# Patient Record
Sex: Male | Born: 1995 | Race: Black or African American | Hispanic: No | Marital: Single | Smoking: Never smoker
Health system: Southern US, Community
[De-identification: ages and names within clinical notes are randomized; demographics above are authoritative.]

## PROBLEM LIST (undated history)

## (undated) DIAGNOSIS — F419 Anxiety disorder, unspecified: Secondary | ICD-10-CM

## (undated) DIAGNOSIS — F32A Depression, unspecified: Secondary | ICD-10-CM

## (undated) DIAGNOSIS — F329 Major depressive disorder, single episode, unspecified: Secondary | ICD-10-CM

---
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
## 2014-03-09 ENCOUNTER — Emergency Department (HOSPITAL_COMMUNITY)
Admission: EM | Admit: 2014-03-09 | Discharge: 2014-03-09 | Disposition: A | Discharge status: Home / Self Care | Payer: 59 | Attending: Emergency Medicine | Admitting: Emergency Medicine

## 2014-03-09 ENCOUNTER — Emergency Department (HOSPITAL_COMMUNITY): Payer: 59

## 2014-03-09 ENCOUNTER — Encounter (HOSPITAL_COMMUNITY): Payer: Self-pay | Admitting: Emergency Medicine

## 2014-03-09 DIAGNOSIS — S60412A Abrasion of right middle finger, initial encounter: Secondary | ICD-10-CM | POA: Insufficient documentation

## 2014-03-09 DIAGNOSIS — Y280XXA Contact with sharp glass, undetermined intent, initial encounter: Secondary | ICD-10-CM | POA: Diagnosis not present

## 2014-03-09 DIAGNOSIS — Y9289 Other specified places as the place of occurrence of the external cause: Secondary | ICD-10-CM | POA: Insufficient documentation

## 2014-03-09 DIAGNOSIS — W228XXA Striking against or struck by other objects, initial encounter: Secondary | ICD-10-CM | POA: Diagnosis not present

## 2014-03-09 DIAGNOSIS — L03011 Cellulitis of right finger: Secondary | ICD-10-CM | POA: Insufficient documentation

## 2014-03-09 DIAGNOSIS — S6991XA Unspecified injury of right wrist, hand and finger(s), initial encounter: Secondary | ICD-10-CM | POA: Diagnosis present

## 2014-03-09 DIAGNOSIS — Y9389 Activity, other specified: Secondary | ICD-10-CM | POA: Insufficient documentation

## 2014-03-09 MED ORDER — IBUPROFEN 200 MG PO TABS
400.0000 mg | ORAL_TABLET | Freq: Once | ORAL | Status: AC
  Administered 2014-03-09: 400 mg via ORAL
  Filled 2014-03-09: qty 2

## 2014-03-09 MED ORDER — NAPROXEN 500 MG PO TABS: 500.0000 mg | ORAL_TABLET | Freq: Two times a day (BID) | ORAL | Status: DC

## 2014-03-09 MED ORDER — OXYCODONE-ACETAMINOPHEN 5-325 MG PO TABS
1.0000 | ORAL_TABLET | Freq: Once | ORAL | Status: AC
  Administered 2014-03-09: 1 via ORAL
  Filled 2014-03-09: qty 1

## 2014-03-09 MED ORDER — CEPHALEXIN 500 MG PO CAPS: 500.0000 mg | ORAL_CAPSULE | Freq: Four times a day (QID) | ORAL | Status: DC
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
## 2014-03-09 NOTE — Discharge Instructions (Signed)
Please follow up in 2 days to have a wound recheck. Take your antibiotics as prescribed.    Cellulitis Cellulitis is an infection of the skin and the tissue beneath it. The infected area is usually red and tender. Cellulitis occurs most often in the arms and lower legs.  CAUSES  Cellulitis is caused by bacteria that enter the skin through cracks or cuts in the skin. The most common types of bacteria that cause cellulitis are staphylococci and streptococci. SIGNS AND SYMPTOMS   Redness and warmth.  Swelling.  Tenderness or pain.  Fever. DIAGNOSIS  Your health care provider can usually determine what is wrong based on a physical exam. Blood tests may also be done. TREATMENT  Treatment usually involves taking an antibiotic medicine. HOME CARE INSTRUCTIONS   Take your antibiotic medicine as directed by your health care provider. Finish the antibiotic even if you start to feel better.  Keep the infected arm or leg elevated to reduce swelling.  Apply a warm cloth to the affected area up to 4 times per day to relieve pain.  Take medicines only as directed by your health care provider.  Keep all follow-up visits as directed by your health care provider. SEEK MEDICAL CARE IF:   You notice red streaks coming from the infected area.  Your red area gets larger or turns dark in color.  Your bone or joint underneath the infected area becomes painful after the skin has healed.  Your infection returns in the same area or another area.  You notice a swollen bump in the infected area.  You develop new symptoms.  You have a fever. SEEK IMMEDIATE MEDICAL CARE IF:   You feel very sleepy.  You develop vomiting or diarrhea.  You have a general ill feeling (malaise) with muscle aches and pains. MAKE SURE YOU:   Understand these instructions.  Will watch your condition.  Will get help right away if you are not doing well or get worse. Document Released: 02/16/2005 Document  Revised: 09/23/2013 Document Reviewed: 07/25/2011 [facility] Patient Information 2015 [place], [place]. This information is not intended to replace advice given to you by your health care provider. Make sure you discuss any questions you have with your health care provider.
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
## 2014-03-12 ENCOUNTER — Encounter (HOSPITAL_COMMUNITY): Payer: Self-pay | Admitting: Emergency Medicine

## 2014-03-12 ENCOUNTER — Emergency Department (HOSPITAL_COMMUNITY)
Admission: EM | Admit: 2014-03-12 | Discharge: 2014-03-12 | Disposition: A | Discharge status: Home / Self Care | Payer: 59 | Attending: Emergency Medicine | Admitting: Emergency Medicine

## 2014-03-12 DIAGNOSIS — Z4801 Encounter for change or removal of surgical wound dressing: Secondary | ICD-10-CM | POA: Insufficient documentation

## 2014-03-12 DIAGNOSIS — Z791 Long term (current) use of non-steroidal anti-inflammatories (NSAID): Secondary | ICD-10-CM | POA: Diagnosis not present

## 2014-03-12 DIAGNOSIS — Z5189 Encounter for other specified aftercare: Secondary | ICD-10-CM

## 2014-03-12 DIAGNOSIS — L03113 Cellulitis of right upper limb: Secondary | ICD-10-CM

## 2014-03-12 DIAGNOSIS — M791 Myalgia: Secondary | ICD-10-CM | POA: Diagnosis not present

## 2014-03-12 DIAGNOSIS — Z792 Long term (current) use of antibiotics: Secondary | ICD-10-CM | POA: Diagnosis not present

## 2014-03-12 NOTE — Discharge Instructions (Signed)
Call for a follow up appointment with a Family or Primary Care Provider.  Call a hand specialist for further evaluation of your hand pain injury. Ice and elevate your hand above your heart to reduce swelling. Return if Symptoms worsen.   Take medication as prescribed.

## 2014-03-12 NOTE — ED Provider Notes (Signed)
Medical screening examination/treatment/procedure(s) were performed by non-physician practitioner and as supervising physician I was immediately available for consultation/collaboration.   EKG Interpretation None        Lyanne CoKevin M Darienne Belleau, MD 03/12/14 1219

## 2014-03-12 NOTE — ED Notes (Signed)
Pt was seen here on 10/18 for wound to right hand and treated for cellulitis. Reports taking antibiotics as prescribed. Mild redness and swelling noted but reports improvement since 10/18, denies any fever. No acute distress noted.
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
## 2014-03-13 NOTE — ED Provider Notes (Signed)
Medical screening examination/treatment/procedure(s) were performed by non-physician practitioner and as supervising physician I was immediately available for consultation/collaboration.   EKG Interpretation None       Jeremyah Jelley, MD 03/13/14 0835 

## 2016-02-22 ENCOUNTER — Emergency Department (HOSPITAL_COMMUNITY): Payer: No Typology Code available for payment source

## 2016-02-22 ENCOUNTER — Emergency Department (HOSPITAL_COMMUNITY)
Admission: EM | Admit: 2016-02-22 | Discharge: 2016-02-22 | Disposition: A | Discharge status: Home / Self Care | Payer: No Typology Code available for payment source | Attending: Emergency Medicine | Admitting: Emergency Medicine

## 2016-02-22 ENCOUNTER — Encounter (HOSPITAL_COMMUNITY): Payer: Self-pay | Admitting: Nurse Practitioner

## 2016-02-22 DIAGNOSIS — Y939 Activity, unspecified: Secondary | ICD-10-CM | POA: Diagnosis not present

## 2016-02-22 DIAGNOSIS — Y999 Unspecified external cause status: Secondary | ICD-10-CM | POA: Insufficient documentation

## 2016-02-22 DIAGNOSIS — Y92410 Unspecified street and highway as the place of occurrence of the external cause: Secondary | ICD-10-CM | POA: Insufficient documentation

## 2016-02-22 DIAGNOSIS — S79911A Unspecified injury of right hip, initial encounter: Secondary | ICD-10-CM | POA: Diagnosis present

## 2016-02-22 DIAGNOSIS — S7001XA Contusion of right hip, initial encounter: Secondary | ICD-10-CM | POA: Diagnosis not present

## 2016-02-22 MED ORDER — METHOCARBAMOL 500 MG PO TABS
1000.0000 mg | ORAL_TABLET | Freq: Four times a day (QID) | ORAL | 0 refills | Status: DC
Start: 2016-02-22 — End: 2016-07-13
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
## 2016-02-22 NOTE — ED Notes (Signed)
Declined W/C at D/C and was escorted to lobby by RN. 
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
## 2016-07-10 ENCOUNTER — Inpatient Hospital Stay (HOSPITAL_COMMUNITY)
Admission: AD | Admit: 2016-07-10 | Discharge: 2016-07-13 | DRG: 885 | Disposition: A | Discharge status: Home / Self Care | Payer: 59 | Attending: Psychiatry | Admitting: Psychiatry

## 2016-07-10 ENCOUNTER — Encounter (HOSPITAL_COMMUNITY): Payer: Self-pay

## 2016-07-10 DIAGNOSIS — F419 Anxiety disorder, unspecified: Secondary | ICD-10-CM | POA: Diagnosis present

## 2016-07-10 DIAGNOSIS — F1220 Cannabis dependence, uncomplicated: Secondary | ICD-10-CM | POA: Diagnosis present

## 2016-07-10 DIAGNOSIS — F411 Generalized anxiety disorder: Secondary | ICD-10-CM | POA: Diagnosis present

## 2016-07-10 DIAGNOSIS — F322 Major depressive disorder, single episode, severe without psychotic features: Secondary | ICD-10-CM | POA: Diagnosis present

## 2016-07-10 DIAGNOSIS — Z79899 Other long term (current) drug therapy: Secondary | ICD-10-CM | POA: Diagnosis not present

## 2016-07-10 DIAGNOSIS — R45851 Suicidal ideations: Secondary | ICD-10-CM | POA: Diagnosis present

## 2016-07-10 DIAGNOSIS — G4700 Insomnia, unspecified: Secondary | ICD-10-CM | POA: Diagnosis present

## 2016-07-10 DIAGNOSIS — Z811 Family history of alcohol abuse and dependence: Secondary | ICD-10-CM | POA: Diagnosis not present

## 2016-07-10 HISTORY — DX: Anxiety disorder, unspecified: F41.9

## 2016-07-10 HISTORY — DX: Depression, unspecified: F32.A

## 2016-07-10 HISTORY — DX: Major depressive disorder, single episode, unspecified: F32.9

## 2016-07-10 MED ORDER — TRAZODONE HCL 50 MG PO TABS
50.0000 mg | ORAL_TABLET | Freq: Every evening | ORAL | Status: DC | PRN
  Filled 2016-07-10 (×5): qty 1

## 2016-07-10 MED ORDER — ALUM & MAG HYDROXIDE-SIMETH 200-200-20 MG/5ML PO SUSP
30.0000 mL | ORAL | Status: DC | PRN
Start: 2016-07-10 — End: 2016-07-13

## 2016-07-10 MED ORDER — MAGNESIUM HYDROXIDE 400 MG/5ML PO SUSP: 30.0000 mL | Freq: Every day | ORAL | Status: DC | PRN

## 2016-07-10 MED ORDER — ACETAMINOPHEN 325 MG PO TABS
650.0000 mg | ORAL_TABLET | Freq: Four times a day (QID) | ORAL | Status: DC | PRN
  Administered 2016-07-12: 650 mg via ORAL
  Filled 2016-07-10: qty 2

## 2016-07-10 MED ORDER — HYDROXYZINE HCL 25 MG PO TABS
25.0000 mg | ORAL_TABLET | Freq: Three times a day (TID) | ORAL | Status: DC | PRN
  Filled 2016-07-10: qty 10
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
## 2016-07-11 ENCOUNTER — Encounter (HOSPITAL_COMMUNITY): Payer: Self-pay

## 2016-07-11 DIAGNOSIS — Z79899 Other long term (current) drug therapy: Secondary | ICD-10-CM

## 2016-07-11 DIAGNOSIS — F322 Major depressive disorder, single episode, severe without psychotic features: Principal | ICD-10-CM

## 2016-07-11 LAB — CBC
HEMATOCRIT: 42.2 % (ref 39.0–52.0)
HEMOGLOBIN: 14.2 g/dL (ref 13.0–17.0)
MCH: 28.3 pg (ref 26.0–34.0)
MCHC: 33.6 g/dL (ref 30.0–36.0)
MCV: 84.1 fL (ref 78.0–100.0)
Platelets: 244 10*3/uL (ref 150–400)
RBC: 5.02 MIL/uL (ref 4.22–5.81)
RDW: 13.9 % (ref 11.5–15.5)
WBC: 7.5 10*3/uL (ref 4.0–10.5)

## 2016-07-11 LAB — COMPREHENSIVE METABOLIC PANEL
ALK PHOS: 63 U/L (ref 38–126)
ALT: 12 U/L — ABNORMAL LOW (ref 17–63)
ANION GAP: 7 (ref 5–15)
AST: 15 U/L (ref 15–41)
Albumin: 4.9 g/dL (ref 3.5–5.0)
BILIRUBIN TOTAL: 0.7 mg/dL (ref 0.3–1.2)
BUN: 12 mg/dL (ref 6–20)
CO2: 30 mmol/L (ref 22–32)
Calcium: 10.1 mg/dL (ref 8.9–10.3)
Chloride: 106 mmol/L (ref 101–111)
Creatinine, Ser: 1.11 mg/dL (ref 0.61–1.24)
GFR calc Af Amer: >60 mL/min (ref >60)
GFR calc non Af Amer: >60 mL/min (ref >60)
GLUCOSE: 101 mg/dL — AB (ref 65–99)
POTASSIUM: 4.1 mmol/L (ref 3.5–5.1)
SODIUM: 143 mmol/L (ref 135–145)
Total Protein: 7.6 g/dL (ref 6.5–8.1)

## 2016-07-11 LAB — RAPID URINE DRUG SCREEN, HOSP PERFORMED
Amphetamines: NOT DETECTED
Barbiturates: NOT DETECTED
Benzodiazepines: POSITIVE — AB
Cocaine: NOT DETECTED
OPIATES: NOT DETECTED
Tetrahydrocannabinol: POSITIVE — AB

## 2016-07-11 LAB — LIPID PANEL
CHOL/HDL RATIO: 2.3 ratio
Cholesterol: 117 mg/dL (ref 0–200)
HDL: 52 mg/dL (ref >40)
LDL CALC: 47 mg/dL (ref 0–99)
Triglycerides: 88 mg/dL (ref <150)
VLDL: 18 mg/dL (ref 0–40)

## 2016-07-11 LAB — TSH: TSH: 0.911 u[IU]/mL (ref 0.350–4.500)

## 2016-07-11 MED ORDER — MIRTAZAPINE 7.5 MG PO TABS
7.5000 mg | ORAL_TABLET | Freq: Every day | ORAL | Status: DC
  Administered 2016-07-11 – 2016-07-12 (×2): 7.5 mg via ORAL
  Filled 2016-07-11: qty 7
  Filled 2016-07-11 (×2): qty 1
  Filled 2016-07-11: qty 7
  Filled 2016-07-11 (×2): qty 1
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
## 2016-07-11 NOTE — Tx Team (Signed)
Initial Treatment Plan 07/11/2016 12:17 AM Nathan Warren WUJ:[id number]    PATIENT STRESSORS: Educational concerns Financial difficulties Loss of personal relationship Marital or family conflict   PATIENT STRENGTHS: Capable of independent living Communication skills Physical Health Supportive family/friends   PATIENT IDENTIFIED PROBLEMS: "Need help with my depression"  "Need help with feeling suicidal"  Depression  Anxiety  Risk for suicide             DISCHARGE CRITERIA:  Ability to meet basic life and health needs Safe-care adequate arrangements made Verbal commitment to aftercare and medication compliance  PRELIMINARY DISCHARGE PLAN: Return to previous living arrangement Return to previous work or school arrangements  PATIENT/FAMILY INVOLVEMENT: This treatment plan has been presented to and reviewed with the patient, Nathan Warren, and/or family member.  The patient and family have been given the opportunity to ask questions and make suggestions.  Eveline KetoAdediran T [name], RN 07/11/2016, 12:17 AM

## 2016-07-11 NOTE — Progress Notes (Signed)
Recreation Therapy Notes  Date: 07/11/16 Time: 0930 Location: 300 Hall Dayroom  Group Topic: Stress Management  Goal Area(s) Addresses:  Patient will verbalize importance of using healthy stress management.  Patient will identify positive emotions associated with healthy stress management.   Intervention: Stress Management  Activity :  Guided Imagery.  LRT introduced to thebstress management technique guided imagery.  LRT read a script that allowed patients to take a "mental vacation" from their surroundings.  Patients were to follow along as LRT read script to engage in the technique.   Education:  Stress Management, Discharge Planning.   Education Outcome: Acknowledges edcuation/In group clarification offered/Needs additional education  Clinical Observations/Feedback: Pt did not attend group.    Karstyn Birkey, LRT/CTRS         Nathan Warren A 07/11/2016 12:03 PM 
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
## 2016-07-11 NOTE — [facility] Suicide Risk Assessment (Signed)
[facility] INPATIENT:  Family/Significant Other Suicide Prevention Education  Suicide Prevention Education:  Education Completed; Conrad Burlingtonisha Steffler-Miller, Pt's mother [phone], has been identified by the patient as the family member/significant other with whom the patient will be residing, and identified as the person(s) who will aid the patient in the event of a mental health crisis (suicidal ideations/suicide attempt).  With written consent from the patient, the family member/significant other has been provided the following suicide prevention education, prior to the and/or following the discharge of the patient.  The suicide prevention education provided includes the following:  Suicide risk factors  Suicide prevention and interventions  National Suicide Hotline telephone number  [facility] assessment telephone number  [facility] Emergency Assistance 911  [facility] and/or Residential Mobile Crisis Unit telephone number  Request made of family/significant other to:  Remove weapons (e.g., guns, rifles, knives), all items previously/currently identified as safety concern.    Remove drugs/medications (over-the-counter, prescriptions, illicit drugs), all items previously/currently identified as a safety concern.  The family member/significant other verbalizes understanding of the suicide prevention education information provided.  The family member/significant other agrees to remove the items of safety concern listed above.  Verdene LennertLauren C Nawal Burling 07/11/2016, 4:17 PM
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
## 2016-07-11 NOTE — [facility] Suicide Risk Assessment (Signed)
[facility]   Nursing information obtained from:  Patient Demographic factors:  Male, Adolescent or young adult, Unemployed Current Mental Status:  Self-harm thoughts Loss Factors:  Loss of significant relationship, Financial problems / change in socioeconomic status Historical Factors:  NA Risk Reduction Factors:  Living with another person, especially a relative  Total Time spent with patient: 45 minutes Principal Problem:  MDD  Diagnosis:   Patient Active Problem List   Diagnosis Date Noted  . Severe major depression ([facility]) [F32.2] 07/10/2016     Continued Clinical Symptoms:  Alcohol Use Disorder Identification Test Final Score (AUDIT): 11 The "Alcohol Use Disorders Identification Test", Guidelines for Use in Primary Care, Second Edition.  World Science writerHealth Organization [facility](WHO). Score between 0-7:  no or low risk or alcohol related problems. Score between 8-15:  moderate risk of alcohol related problems. Score between 16-19:  high risk of alcohol related problems. Score 20 or above:  warrants further diagnostic evaluation for alcohol dependence and treatment.   CLINICAL FACTORS:  [age] year old single male, reports recent depression, following break up with GF two weeks ago. Reports impulsive suicidal attempt by choking self two weeks ago, and some intermittent passive SI since then. Brought to hospital by family members after he stopped answering his phone for a few hours, but states he has actually been feeling a little better and no longer suicidal . Does endorse neuro -vegetative symptoms.   Psychiatric Specialty Exam: Physical Exam  ROS  Blood pressure 120/78, pulse 91, temperature 98.5 F (36.9 C), temperature source Oral, resp. rate 16, height 5' 8.9" (1.75 m), weight 60.3 kg (133 lb), SpO2 100 %.Body mass index is 19.7 kg/m.  See admit note MSE                                                         COGNITIVE FEATURES THAT  CONTRIBUTE TO RISK:  Closed-mindedness and Loss of executive function    SUICIDE RISK:   Moderate:  Frequent suicidal ideation with limited intensity, and duration, some specificity in terms of plans, no associated intent, good self-control, limited dysphoria/symptomatology, some risk factors present, and identifiable protective factors, including available and accessible social support.  PLAN OF CARE: Patient will be admitted to inpatient psychiatric unit for stabilization and safety. Will provide and encourage milieu participation. Provide medication management and maked adjustments as needed.  Will follow daily.    I certify that inpatient services furnished can reasonably be expected to improve the patient's condition.   Nehemiah MassedOBOS, FERNANDO, MD 07/11/2016, 6:10 PM
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
## 2016-07-11 NOTE — Progress Notes (Signed)
Patient ID: Nathan PikeJamir Warren, male   DOB: 1995-10-04, 21 y.o.   MRN: [id number]  DAR: Pt. Denies SI/HI and A/V Hallucinations. He refused to fill out his self inventory sheet. Patient does not report any pain or discomfort at this time. Support and encouragement provided to the patient however patient reports he just wants to go home. He is isolated to his room and is not interacting with his peers. Patient does not appear vested in treatment at this time. Q15 minute checks are maintained for safety.

## 2016-07-12 NOTE — Progress Notes (Signed)
Pt attend group. His day was 7. Pt said his goal was to relax. This is pt first time attend wrap up group.

## 2016-07-12 NOTE — Progress Notes (Signed)
[facility] MD Progress Note  07/12/2016 5:05 PM Nathan Warren  MRN:  [id number] Subjective:  Patient states he is feeling better. At this time denies any suicidal or self injurious ideations . Focused on going home soon and returning to college. Regarding recent break up, states " it still bothers me but not that much anymore" and states he realizes he needs to focus on himself and his career. Denies medication side effects thus far . Objective : I have discussed case with treatment team and have met with patient. Patient reports he is feeling better and at this time his focus is to discharge soon. Focused on returning to college , and as above, less focused on recent break up. Presents vaguely irritable, but affect readily improves during session, and starts smiling often, appropriately with support, empathy.  Denies any suicidal or self injurious ideations. Behavior on unit calm, in good control, but tends to stay to self and limited group participation. Patient states he tends to feel uncomfortable in group setting, does better on " one to one" basis, but of note, denies any social anxiety symptoms. Denies medication side effects Principal Problem:  Depression Diagnosis:   Patient Active Problem List   Diagnosis Date Noted  . Severe major depression ([place]) [F32.2] 07/10/2016   Total Time spent with patient: 20 minutes  Past Medical History:  Past Medical History:  Diagnosis Date  . Anxiety   . Depression    History reviewed. No pertinent surgical history. Family History: History reviewed. No pertinent family history.  Social History:  History  Alcohol Use  . 3.6 oz/week  . 6 Cans of beer per week     History  Drug Use  . Frequency: 1.0 time per week  . Types: Marijuana    Social History   Social History  . Marital status: Single    Spouse name: N/A  . Number of children: N/A  . Years of education: N/A   Social History Main Topics  . Smoking status: Never Smoker  . Smokeless  tobacco: Never Used  . Alcohol use 3.6 oz/week    6 Cans of beer per week  . Drug use: Yes    Frequency: 1.0 time per week    Types: Marijuana  . Sexual activity: Yes   Other Topics Concern  . None   Social History Narrative  . None   Additional Social History:    Pain Medications: See MAR Prescriptions: See MAR Over the Counter: See MAR History of alcohol / drug use?: Yes Negative Consequences of Use: Personal relationships, Work / School Name of Substance 1: Marijuana 1 - Age of First Use: UTA 1 - Amount (size/oz): Pt reported, smoking marijuana two days ago.  1 - Frequency: UTA 1 - Duration: UTA 1 - Last Use / Amount: Pt reported, smoking two days ago.  Name of Substance 2: Alcohol 2 - Age of First Use: UTA 2 - Amount (size/oz): Pt reported, "about 6 cans of beer a week" 2 - Frequency: UTA 2 - Duration: UTA 2 - Last Use / Amount: Pt reported, today.   Sleep: Good  Appetite:  Good  Current Medications: Current Facility-Administered Medications  Medication Dose Route Frequency Provider Last Rate Last Dose  . acetaminophen (TYLENOL) tablet 650 mg  650 mg Oral Q6H PRN Rozetta Nunnery, NP   650 mg at 07/12/16 1215  . alum & mag hydroxide-simeth (MAALOX/MYLANTA) 200-200-20 MG/5ML suspension 30 mL  30 mL Oral Q4H PRN Rozetta Nunnery, NP      .  hydrOXYzine (ATARAX/VISTARIL) tablet 25 mg  25 mg Oral TID PRN Rozetta Nunnery, NP      . magnesium hydroxide (MILK OF MAGNESIA) suspension 30 mL  30 mL Oral Daily PRN Rozetta Nunnery, NP      . mirtazapine (REMERON) tablet 7.5 mg  7.5 mg Oral QHS Jenne Campus, MD   7.5 mg at 07/11/16 2103    Lab Results:  Results for orders placed or performed during the hospital encounter of 07/10/16 (from the past 48 hour(s))  Urine rapid drug screen (hosp performed)not at [facility]     Status: Abnormal   Collection Time: 07/11/16  5:00 AM  Result Value Ref Range   Opiates NONE DETECTED NONE DETECTED   Cocaine NONE DETECTED NONE DETECTED    Benzodiazepines POSITIVE (A) NONE DETECTED   Amphetamines NONE DETECTED NONE DETECTED   Tetrahydrocannabinol POSITIVE (A) NONE DETECTED   Barbiturates NONE DETECTED NONE DETECTED    Comment:        DRUG SCREEN FOR MEDICAL PURPOSES ONLY.  IF CONFIRMATION IS NEEDED FOR ANY PURPOSE, NOTIFY LAB WITHIN 5 DAYS.        LOWEST DETECTABLE LIMITS FOR URINE DRUG SCREEN Drug Class       Cutoff (ng/mL) Amphetamine      1000 Barbiturate      200 Benzodiazepine   945 Tricyclics       859 Opiates          300 Cocaine          300 THC              50 Performed at [facility], [place] [address] [place], [place] [address]   CBC     Status: None   Collection Time: 07/11/16  6:01 AM  Result Value Ref Range   WBC 7.5 4.0 - 10.5 K/uL   RBC 5.02 4.22 - 5.81 MIL/uL   Hemoglobin 14.2 13.0 - 17.0 g/dL   HCT 42.2 39.0 - 52.0 %   MCV 84.1 78.0 - 100.0 fL   MCH 28.3 26.0 - 34.0 pg   MCHC 33.6 30.0 - 36.0 g/dL   RDW 13.9 11.5 - 15.5 %   Platelets 244 150 - 400 K/uL    Comment: Performed at [facility], [place] [address] [place], [place] [address]  Comprehensive metabolic panel     Status: Abnormal   Collection Time: 07/11/16  6:01 AM  Result Value Ref Range   Sodium 143 135 - 145 mmol/L   Potassium 4.1 3.5 - 5.1 mmol/L   Chloride 106 101 - 111 mmol/L   CO2 30 22 - 32 mmol/L   Glucose, Bld 101 (H) 65 - 99 mg/dL   BUN 12 6 - 20 mg/dL   Creatinine, Ser 1.11 0.61 - 1.24 mg/dL   Calcium 10.1 8.9 - 10.3 mg/dL   Total Protein 7.6 6.5 - 8.1 g/dL   Albumin 4.9 3.5 - 5.0 g/dL   AST 15 15 - 41 U/L   ALT 12 (L) 17 - 63 U/L   Alkaline Phosphatase 63 38 - 126 U/L   Total Bilirubin 0.7 0.3 - 1.2 mg/dL   GFR calc non Af Amer >60 >60 mL/min   GFR calc Af Amer >60 >60 mL/min    Comment: (NOTE) The eGFR has been calculated using the CKD EPI equation. This calculation has not been validated in all clinical situations. eGFR's persistently <60 mL/min signify possible Chronic  Kidney Disease.  Anion gap 7 5 - 15    Comment: Performed at [facility], [place] [address], [place], [place] [address]  Lipid panel     Status: None   Collection Time: 07/11/16  6:01 AM  Result Value Ref Range   Cholesterol 117 0 - 200 mg/dL   Triglycerides 88 <150 mg/dL   HDL 52 >40 mg/dL   Total CHOL/HDL Ratio 2.3 RATIO   VLDL 18 0 - 40 mg/dL   LDL Cholesterol 47 0 - 99 mg/dL    Comment:        Total Cholesterol/HDL:CHD Risk Coronary Heart Disease Risk Table                     Men   Women  1/2 Average Risk   3.4   3.3  Average Risk       5.0   4.4  2 X Average Risk   9.6   7.1  3 X Average Risk  23.4   11.0        Use the calculated Patient Ratio above and the CHD Risk Table to determine the patient's CHD Risk.        ATP III CLASSIFICATION (LDL):  <100     mg/dL   Optimal  100-129  mg/dL   Near or Above                    Optimal  130-159  mg/dL   Borderline  160-189  mg/dL   High  >190     mg/dL   Very High Performed at [place] [address] [place], [place] [address]   TSH     Status: None   Collection Time: 07/11/16  6:01 AM  Result Value Ref Range   TSH 0.911 0.350 - 4.500 uIU/mL    Comment: Performed by a 3rd Generation assay with a functional sensitivity of <=0.01 uIU/mL. Performed at [facility], [place] [address], [place], [place] [address]     Blood Alcohol level:  No results found for: [facility]  Metabolic Disorder Labs: No results found for: HGBA1C, MPG No results found for: PROLACTIN Lab Results  Component Value Date   CHOL 117 07/11/2016   TRIG 88 07/11/2016   HDL 52 07/11/2016   CHOLHDL 2.3 07/11/2016   VLDL 18 07/11/2016   LDLCALC 47 07/11/2016    Physical Findings: AIMS: Facial and Oral Movements Muscles of Facial Expression: None, normal Lips and Perioral Area: None, normal Jaw: None, normal Tongue: None, normal,Extremity Movements Upper (arms, wrists, hands, fingers): None,  normal Lower (legs, knees, ankles, toes): None, normal, Trunk Movements Neck, shoulders, hips: None, normal, Overall Severity Severity of abnormal movements (highest score from questions above): None, normal Incapacitation due to abnormal movements: None, normal Patient's awareness of abnormal movements (rate only patient's report): No Awareness, Dental Status Current problems with teeth and/or dentures?: No Does patient usually wear dentures?: No  CIWA:    COWS:     Musculoskeletal: Strength & Muscle Tone: within normal limits Gait & Station: normal Patient leans: N/A  Psychiatric Specialty Exam: Physical Exam  ROS denies headache, denies chest pain, denies shortness of breath, no rash   Blood pressure 124/77, pulse 74, temperature 98 F (36.7 C), temperature source Oral, resp. rate 16, height 5' 8.9" (1.75 m), weight 60.3 kg (133 lb), SpO2 100 %.Body mass index is 19.7 kg/m.  General Appearance: improved grooming   Eye Contact:  Good  Speech:  Normal Rate  Volume:  Normal  Mood:  states his mood is better, and currently minimizes depression  Affect:  mildly irritable at first, but this improved significantly during session, presenting with a fuller range of affect as session progressed   Thought Process:  Linear and Descriptions of Associations: Intact  Orientation:  Full (Time, Place, and Person)  Thought Content:  denies hallucinations, no delusions, not internally preoccupied   Suicidal Thoughts:  No denies any suicidal or self injurious ideations, contracts for safety on unit, denies any homicidal or violent ideations  Homicidal Thoughts:  No  Memory:  recent and remote grossly intact   Judgement:  Fair- improving   Insight:  Fair  Psychomotor Activity:  Normal  Concentration:  Concentration: Good and Attention Span: Good  Recall:  Good  Fund of Knowledge:  Good  Language:  Good  Akathisia:  Negative  Handed:  Right  AIMS (if indicated):     Assets:  Desire for  Improvement Resilience  ADL's:  Intact  Cognition:  WNL  Sleep:  Number of Hours: 6.75   Assessment - patient reports feeling better, minimizes depression at this time, and denies any suicidal or self injurious ideations. Presents future oriented, and is focused on discharging soon so he can return to college , minimize interruption to his academic requirements . Less focused and less ruminative about recent break up. Of note, also denies any violent or homicidal ideation towards ex GF.  Limited milieu participation . Thus far tolerating Remeron well, denies side effects.  Treatment Plan Summary: Daily contact with patient to assess and evaluate symptoms and progress in treatment, Medication management, Plan inpatient treatment and medications as below Encourage milieu and group participation to work on coping skills and symptom reduction  Continue Remeron 7.5 mgrs QHS for depression, insomnia Continue Vistaril 25 mgrs TID PRN for anxiety Treatment team working on disposition planning options  Neita Garnet, MD 07/12/2016, 5:05 PM

## 2016-07-12 NOTE — Progress Notes (Signed)
D: Pt was in the hallway upon initial approach.  Pt presents with appropriate affect and mood.  He has been more visible in milieu tonight.  He smiles at times while interacting with peers.  Pt reports he "shot basketball" today and he had a good visit with his mother and uncle.  Pt denies SI/HI, denies hallucinations, denies pain.  Pt attended evening group.    A: Actively listened to pt and offered support and encouragement. Medication administered per order.  Q15 minute safety checks maintained.  R: Pt is safe on the unit.  Pt is compliant with medications.  Pt verbally contracts for safety.

## 2016-07-12 NOTE — Plan of Care (Signed)
Problem: Medication: Goal: Compliance with prescribed medication regimen will improve Outcome: Progressing Pt has been compliant with medication regimen tonight.   

## 2016-07-12 NOTE — Tx Team (Signed)
Interdisciplinary Treatment and Diagnostic Plan Update  07/12/2016 Time of Session: 9:30am Nathan Warren MRN: [id number]  Principal Diagnosis: MDD, no psychotic features   Secondary Diagnoses: Active Problems:   Severe major depression (HCC)   Current Medications:  Current Facility-Administered Medications  Medication Dose Route Frequency Provider Last Rate Last Dose  . acetaminophen (TYLENOL) tablet 650 mg  650 mg Oral Q6H PRN Jackelyn Poling, NP      . alum & mag hydroxide-simeth (MAALOX/MYLANTA) 200-200-20 MG/5ML suspension 30 mL  30 mL Oral Q4H PRN Jackelyn Poling, NP      . hydrOXYzine (ATARAX/VISTARIL) tablet 25 mg  25 mg Oral TID PRN Jackelyn Poling, NP      . magnesium hydroxide (MILK OF MAGNESIA) suspension 30 mL  30 mL Oral Daily PRN Jackelyn Poling, NP      . mirtazapine (REMERON) tablet 7.5 mg  7.5 mg Oral QHS Craige Cotta, MD   7.5 mg at 07/11/16 2103    PTA Medications: Prescriptions Prior to Admission  Medication Sig Dispense Refill Last Dose  . methocarbamol (ROBAXIN) 500 MG tablet Take 2 tablets (1,000 mg total) by mouth 4 (four) times daily. (Patient not taking: Reported on 07/11/2016) 20 tablet 0 Unknown at Unknown time    Treatment Modalities: Medication Management, Group therapy, Case management,  1 to 1 session with clinician, Psychoeducation, Recreational therapy.  Patient Stressors: [employer] fast streamer difficulties Loss of personal relationship Marital or family conflict  Patient Strengths: Capable of independent living Manufacturing systems engineer Physical Health Supportive family/friends  Physician Treatment Plan for Primary Diagnosis: MDD, no psychotic features  Long Term Goal(s): Improvement in symptoms so as ready for discharge  Short Term Goals: Ability to verbalize feelings will improve Ability to disclose and discuss suicidal ideas Ability to demonstrate self-control will improve Ability to identify and develop effective coping behaviors will  improve Ability to maintain clinical measurements within normal limits will improve Ability to verbalize feelings will improve Ability to disclose and discuss suicidal ideas Ability to demonstrate self-control will improve Ability to identify and develop effective coping behaviors will improve Ability to maintain clinical measurements within normal limits will improve  Medication Management: Evaluate patient's response, side effects, and tolerance of medication regimen.  Therapeutic Interventions: 1 to 1 sessions, Unit Group sessions and Medication administration.  Evaluation of Outcomes: Adequate for Discharge  Physician Treatment Plan for Secondary Diagnosis: Active Problems:   Severe major depression (HCC)   Long Term Goal(s): Improvement in symptoms so as ready for discharge  Short Term Goals: Ability to verbalize feelings will improve Ability to disclose and discuss suicidal ideas Ability to demonstrate self-control will improve Ability to identify and develop effective coping behaviors will improve Ability to maintain clinical measurements within normal limits will improve Ability to verbalize feelings will improve Ability to disclose and discuss suicidal ideas Ability to demonstrate self-control will improve Ability to identify and develop effective coping behaviors will improve Ability to maintain clinical measurements within normal limits will improve  Medication Management: Evaluate patient's response, side effects, and tolerance of medication regimen.  Therapeutic Interventions: 1 to 1 sessions, Unit Group sessions and Medication administration.  Evaluation of Outcomes: Adequate for Discharge   RN Treatment Plan for Primary Diagnosis: MDD, no psychotic features  Long Term Goal(s): Knowledge of disease and therapeutic regimen to maintain health will improve  Short Term Goals: Ability to verbalize feelings will improve, Ability to disclose and discuss suicidal ideas  and Ability to identify and develop effective coping  behaviors will improve  Medication Management: RN will administer medications as ordered by provider, will assess and evaluate patient's response and provide education to patient for prescribed medication. RN will report any adverse and/or side effects to prescribing provider.  Therapeutic Interventions: 1 on 1 counseling sessions, Psychoeducation, Medication administration, Evaluate responses to treatment, Monitor vital signs and CBGs as ordered, Perform/monitor CIWA, COWS, AIMS and Fall Risk screenings as ordered, Perform wound care treatments as ordered.  Evaluation of Outcomes: Adequate for Discharge   LCSW Treatment Plan for Primary Diagnosis: MDD, no psychotic features  Long Term Goal(s): Safe transition to appropriate next level of care at discharge, Engage patient in therapeutic group addressing interpersonal concerns.  Short Term Goals: Engage patient in aftercare planning with referrals and resources, Identify triggers associated with mental health/substance abuse issues and Increase skills for wellness and recovery  Therapeutic Interventions: Assess for all discharge needs, 1 to 1 time with Social worker, Explore available resources and support systems, Assess for adequacy in community support network, Educate family and significant other(s) on suicide prevention, Complete Psychosocial Assessment, Interpersonal group therapy.  Evaluation of Outcomes: Adequate for Discharge   Progress in Treatment: Attending groups: No Participating in groups: No  Taking medication as prescribed: Yes, MD continues to assess for medication changes as needed Toleration medication: Yes, no side effects reported at this time Family/Significant other contact made: Yes with mother Patient understands diagnosis: Limited insight Discussing patient identified problems/goals with staff: Yes Medical problems stabilized or resolved: Yes Denies  suicidal/homicidal ideation: Yes Issues/concerns per patient self-inventory: None Other: N/A  New problem(s) identified: None identified at this time.   New Short Term/Long Term Goal(s): None identified at this time.   Discharge Plan or Barriers: Pt will return home and follow-up with outpatient services with [facility]  Reason for Continuation of Hospitalization: Anxiety Depression Medication stabilization  Estimated Length of Stay: 1 day  Attendees: Patient: 07/12/2016  9:51 AM  Physician: Dr. Jama Flavorsobos 07/12/2016  9:51 AM  Nursing: Liborio NixonPatrice White, Charlestine MassedHeather Reddick, RN 07/12/2016  9:51 AM  RN Care Manager: Onnie BoerJennifer Clark, RN 07/12/2016  9:51 AM  Social Worker: Vernie ShanksLauren Kaavya Puskarich, LCSW; Donnelly StagerLynn Bryant, LCSWA 07/12/2016  9:51 AM  Recreational Therapist:  07/12/2016  9:51 AM  Other: Armandina StammerAgnes Nwoko, NP; Gray BernhardtMay Augustin, NP 07/12/2016  9:51 AM  Other:  07/12/2016  9:51 AM  Other: 07/12/2016  9:51 AM    Scribe for Treatment Team: Verdene LennertLauren C Jimmy Stipes, LCSW 07/12/2016 9:51 AM

## 2016-07-12 NOTE — Progress Notes (Signed)
DAR Note: Nathan Warren has been in the bed most of the morning.  He reported that he just wants to go home.  He denies SI/HI or A/V hallucinations.  He complained of head ache and tylenol given prn with good relief.  He has not been attending groups even after much encouragement.  "I don't want to talk to anyone, I just want to leave."  He completed his self inventory and reported that his depression is 4/10 and his hopelessness and anxiety are 0/10.  He stated his goal for today was "go home" and he will accomplish this goal by "let me leave."  Encouraged participation in group and unit activities.  Q 15 minute checks maintained for safety.  We will continue to monitor the progress towards his goals.

## 2016-07-12 NOTE — Progress Notes (Signed)
D: Pt was in his room with visitor upon initial approach.  Pt presents with depressed affect and mood.  He reports his day "sucked."  Pt reports he had a good visit with his father.  He reports his goal is to "relax."  Pt denies SI/HI, denies hallucinations, denies pain.  Pt has been isolative to his room for the majority of the night.    A: Introduced self to pt.  Actively listened to pt and offered support and encouragement. Medication administered per order.  Q15 minute safety checks maintained.  R: Pt is safe on the unit.  Pt is compliant with medication.  Pt verbally contracts for safety.

## 2016-07-12 NOTE — [facility] Group Notes (Signed)
[facility] Group Notes:  (Nursing/MHT/Case Management/Adjunct)  Date:  07/12/2016  Time:  930am  Type of Therapy:  Nurse Education  Participation Level:  Did not attend  Participation Quality:    Affect:    Cognitive:   Insight:    Engagement in Group:   Modes of Intervention:  Discussion and Education  Summary of Progress/Problems:  Afshin chose not to attend group.  Norm ParcelHeather V Diamante Truszkowski 07/12/2016, 10:56 AM

## 2016-07-12 NOTE — Plan of Care (Signed)
Problem: Safety: Goal: Periods of time without injury will increase Outcome: Progressing Pt has not harmed self or others since admission.  He denies SI/HI and verbally contracts for safety.  Pt is a low fall risk.

## 2016-07-12 NOTE — Progress Notes (Signed)
Recreation Therapy Notes  Animal-Assisted Activity (AAA) Program Checklist/Progress Notes Patient Eligibility Criteria Checklist & Daily Group note for Rec TxIntervention  Date: 02.20.2018 Time: 2:45pm [facility] [address]    AAA/T Program Assumption of Risk Form signed by Patient/ or Parent Legal Guardian Yes  Patient is free of allergies or sever asthma Yes  Patient reports no fear of animals Yes  Patient reports no history of cruelty to animals Yes  Patient understands his/her participation is voluntary Yes  Patient washes hands before animal contact Yes  Patient washes hands after animal contact Yes  Behavioral Response: Engaged, Appropriate   Education:Hand Washing, Appropriate Animal Interaction   Education Outcome: Acknowledges education.   Clinical Observations/Feedback: Patient attended session and interacted appropriately with therapy dog and peers.    Nathan Warren, LRT/CTRS        [facility] 07/12/2016 3:01 PM

## 2016-07-12 NOTE — [facility] Group Notes (Signed)
[facility] LCSW Group Therapy 07/12/2016 1:15 PM  Type of Therapy: Group Therapy- Feelings about Diagnosis  Pt did not attend, declined invitation.   Vernie ShanksLauren Lakeyta Vandenheuvel, LCSW 07/12/2016 4:00 PM

## 2016-07-12 NOTE — Progress Notes (Signed)
  [facility] :  Will you be returning to the same living situation after discharge:  Yes,  Pt returning home At discharge, do you have transportation home?: Yes,  Pt family to pick up Do you have the ability to pay for your medications: Yes,  Pt provided with prescriptions  Release of information consent forms completed and in the chart;  Patient's signature needed at discharge.  Patient to Follow up at: Follow-up Information    [facility] Follow up on 07/14/2016.   Why:  at 3:30pm for your intial therapy appointment. Please arrive at 3:00pm Contact information: [address] [place], [place] [address] [phone] - phone [phone] - fax          Next level of care provider has access to [facility] Link:no  Safety Planning and Suicide Prevention discussed: Yes,  with mother; see SPE note  Have you used any form of tobacco in the last 30 days? (Cigarettes, Smokeless Tobacco, Cigars, and/or Pipes): No  Has patient been referred to the Quitline?: N/A patient is not a smoker  Patient has been referred for addiction treatment: Yes  Verdene LennertLauren C Zanyia Silbaugh 07/12/2016, 3:18 PM

## 2016-07-13 MED ORDER — HYDROXYZINE HCL 25 MG PO TABS: 25.0000 mg | ORAL_TABLET | Freq: Three times a day (TID) | ORAL | 0 refills | Status: AC | PRN

## 2016-07-13 MED ORDER — MIRTAZAPINE 7.5 MG PO TABS: 7.5000 mg | ORAL_TABLET | Freq: Every day | ORAL | 0 refills | Status: AC

## 2016-07-13 NOTE — Discharge Summary (Addendum)
Physician Discharge Summary Note  Patient:  Nathan Warren is an 21 y.o., male MRN:  [id number] DOB:  Apr 05, 1996 Patient phone:  [phone] (home)  Patient address:   [address] [place] [address],  Total Time spent with patient: 30 minutes  Date of Admission:  07/10/2016 Date of Discharge: 07/13/16  Reason for Admission:  Suicidal ideations   Principal Problem:  Suicidal Ideations Discharge Diagnoses: Patient Active Problem List   Diagnosis Date Noted  . Severe major depression (HCC) [F32.2] 07/10/2016    Past Psychiatric History:  no prior psychiatric admissions, denies any history of suicide attempts other than as above in HPI, denies history of self cutting . Reports prior episodes of mild depression, but states that never as severe as recently , following recent break up. Denies history of mania, denies history of PTSD, denies history of psychosis. States he has never been on psychiatric medications .  Past Medical History:  Past Medical History:  Diagnosis Date  . Anxiety   . Depression    History reviewed. No pertinent surgical history. Family History: History reviewed. No pertinent family history. Family Psychiatric  History:Denies any mental illness in family, no suicides in family. Grandfather was alcoholic , died from complications of alcohol dependence  Social History:  History  Alcohol Use  . 3.6 oz/week  . 6 Cans of beer per week     History  Drug Use  . Frequency: 1.0 time per week  . Types: Marijuana    Social History   Social History  . Marital status: Single    Spouse name: N/A  . Number of children: N/A  . Years of education: N/A   Social History Main Topics  . Smoking status: Never Smoker  . Smokeless tobacco: Never Used  . Alcohol use 3.6 oz/week    6 Cans of beer per week  . Drug use: Yes    Frequency: 1.0 time per week    Types: Marijuana  . Sexual activity: Yes   Other Topics Concern  . None   Social History  Narrative  . None    Hospital Course:  Patient reported improvement in mood, denied any suicidal ideations or self injurious ideations, and gradually exhibited an improved range of affect. There were no psychotic symptoms .  Behavior remained calm and in good control. He remained future oriented, focused on returning to college/work  soon after discharge. Patient was started on Remeron, which he tolerated well . Side effects were reviewed .  Patient requested discharge, and was discharged as per his request , as no ongoing grounds for involuntary commitment .     Physical Findings: AIMS: Facial and Oral Movements Muscles of Facial Expression: None, normal Lips and Perioral Area: None, normal Jaw: None, normal Tongue: None, normal,Extremity Movements Upper (arms, wrists, hands, fingers): None, normal Lower (legs, knees, ankles, toes): None, normal, Trunk Movements Neck, shoulders, hips: None, normal, Overall Severity Severity of abnormal movements (highest score from questions above): None, normal Incapacitation due to abnormal movements: None, normal Patient's awareness of abnormal movements (rate only patient's report): No Awareness, Dental Status Current problems with teeth and/or dentures?: No Does patient usually wear dentures?: No  CIWA:    COWS:     Musculoskeletal: Strength & Muscle Tone: within normal limits Gait & Station: normal Patient leans: N/A  Psychiatric Specialty Exam: Physical Exam  ROS  Blood pressure 112/83, pulse 85, temperature 97.4 F (36.3 C), temperature source Oral, resp. rate 18, height 5' 8.9" (1.75  m), weight 60.3 kg (133 lb), SpO2 100 %.Body mass index is 19.7 kg/m.   see MSE in Suicide Risk Assessment   Have you used any form of tobacco in the last 30 days? (Cigarettes, Smokeless Tobacco, Cigars, and/or Pipes): No  Has this patient used any form of tobacco in the last 30 days? (Cigarettes, Smokeless Tobacco, Cigars, and/or Pipes) Yes,  No  Blood Alcohol level:  No results found for: [facility]  Metabolic Disorder Labs:  No results found for: HGBA1C, MPG No results found for: PROLACTIN Lab Results  Component Value Date   CHOL 117 07/11/2016   TRIG 88 07/11/2016   HDL 52 07/11/2016   CHOLHDL 2.3 07/11/2016   VLDL 18 07/11/2016   LDLCALC 47 07/11/2016    See Psychiatric Specialty Exam and Suicide Risk Assessment completed by Attending Physician prior to discharge.  Discharge destination:  Home  Is patient on multiple antipsychotic therapies at discharge:  No   Has Patient had three or more failed trials of antipsychotic monotherapy by history:  No  Recommended Plan for Multiple Antipsychotic Therapies: NA   Allergies as of 07/13/2016   No Known Allergies     Medication List    STOP taking these medications   methocarbamol 500 MG tablet Commonly known as:  ROBAXIN     TAKE these medications     Indication  hydrOXYzine 25 MG tablet Commonly known as:  ATARAX/VISTARIL Take 1 tablet (25 mg total) by mouth 3 (three) times daily as needed for anxiety.  Indication:  Anxiety Neurosis   mirtazapine 7.5 MG tablet Commonly known as:  REMERON Take 1 tablet (7.5 mg total) by mouth at bedtime. For insomnia  Indication:  Insomnia      Follow-up Information    [facility] Follow up on 07/14/2016.   Why:  at 3:30pm for your intial therapy appointment. Please arrive at 3:00pm Contact information: [address] [place], [place] [address] [phone] - phone [phone] - fax          Follow-up recommendations:  See D/C Suicide Risk Assessment   Comments:   NA  Signed: Nehemiah Massed, MD 07/13/2016, 12:48 PM
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
## 2016-07-13 NOTE — Progress Notes (Signed)
Patient ID: Nathan Warren, male   DOB: Jul 01, 1995, 21 y.o.   MRN: [id number]  Discharge Note: Pt. Denies SI/HI and A/V hallucinations. Belongings returned to patient at time of discharge. Patient denies any pain or discomfort. Discharge instructions and medications were reviewed with patient. Patient verbalized understanding of both medications and discharge instructions. Patient discharged to lobby where her ride was waiting. Q15 minute safety checks maintained until time of discharge. No distress upon discharge.

## 2016-07-13 NOTE — BHH Suicide Risk Assessment (Signed)
[facility] Discharge Suicide Risk Assessment   Principal Problem:  Suicidal Ideations Discharge Diagnoses:  Patient Active Problem List   Diagnosis Date Noted  . Severe major depression (HCC) [F32.2] 07/10/2016    Total Time spent with patient: 30 minutes  Musculoskeletal: Strength & Muscle Tone: within normal limits Gait & Station: normal Patient leans: N/A  Psychiatric Specialty Exam: ROS no headache, no chest pain, no shortness of breath, no fever, no rash   Blood pressure 112/83, pulse 85, temperature 97.4 F (36.3 C), temperature source Oral, resp. rate 18, height 5' 8.9" (1.75 m), weight 60.3 kg (133 lb), SpO2 100 %.Body mass index is 19.7 kg/m.  General Appearance: Well Groomed  Eye Contact::  Good  Speech:  Normal Rate409  Volume:  Normal  Mood:  states mood is "OK", denies feeling depressed, presents euthymic today   Affect:  Appropriate and Full Range  Thought Process:  Linear  Orientation:  Full (Time, Place, and Person)  Thought Content:  no hallucinations, no delusions, not internally preoccupied   Suicidal Thoughts:  No denies any suicidal or self injurious ideations, denies any violent or homicidal ideations, specifically also denies any violent ideations towards ex GF   Homicidal Thoughts:  No  Memory:  recent and remote grossly intact   Judgement:  Other:  improving  Insight:  improving   Psychomotor Activity:  normal  Concentration:  Good  Recall:  Good  Fund of Knowledge:Good  Language: Good  Akathisia:  Negative  Handed:  Right  AIMS (if indicated):     Assets:  Communication Skills Desire for Improvement Social Support  Sleep:  Number of Hours: 6.75  Cognition: WNL  ADL's:  Intact   Mental Status Per Nursing Assessment::   On Admission:  Self-harm thoughts  Demographic Factors:  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, college student Administrator ) , lives with brother in off campus housing, employed   Loss Factors: Academic load, recent break up with  GF  Historical Factors: No prior psychiatric admissions, had never attempted suicide in the past , no prior psychiatric medication trials  Risk Reduction Factors:   Sense of responsibility to family, Employed, Living with another person, especially a relative, Positive social support, Positive therapeutic relationship and invested in graduating college   Continued Clinical Symptoms:  At this time patient is alert, attentive, well related, pleasant, mood is improved, affect is appropriate, more reactive, denies feeling depressed, no thought disorder, no suicidal or self injurious ideations, no violent or homicidal ideations,  no psychotic symptoms , future oriented.  Denies any medication side effects- we have reviewed side effect profile, to include potential increased risk of suicidal or self injurious ideations early in treatment with antidepressants in young adults . Behavior on unit calm, in good control.    Cognitive Features That Contribute To Risk:  No gross cognitive deficits noted upon discharge. Is alert , attentive, and oriented x 3   Suicide Risk:  Mild:  Suicidal ideation of limited frequency, intensity, duration, and specificity.  There are no identifiable plans, no associated intent, mild dysphoria and related symptoms, good self-control (both objective and subjective assessment), few other risk factors, and identifiable protective factors, including available and accessible social support.  Follow-up Information    [facility] Follow up on 07/14/2016.   Why:  at 3:30pm for your intial therapy appointment. Please arrive at 3:00pm Contact information: [address] [place], [place] [address] [phone] - phone [phone] - fax  Plan Of Care/Follow-up recommendations:  Activity:  as tolerated  Diet:  Regular Tests:  NA Other:  See below  Patient is requesting discharge and there are no current grounds for involuntary commitment at  this time He is planning on returning home, and looking forward to returning to college Monday Follow up as above  Nehemiah Massed, MD 07/13/2016, 11:10 AM

## 2016-07-13 NOTE — Progress Notes (Signed)
Patient ID: Nathan PikeJamir Warren, male   DOB: [birth year], 21 y.o.   MRN: [id number]  DAR: Pt. Denies SI/HI and A/V Hallucinations. He reports sleep is poor, appetite is poor, energy level is low, and concentration is good, He rates depression 4/10, hopelessness 0/10, and anxiety 0/10. Patient does not report any pain or discomfort at this time. Support and encouragement provided to the patient. No scheduled medications at this time, no PRN medications necessary either Patient is minimal with Clinical research associatewriter but pleasant.  He reports he is ready for discharge. Q15 minute checks are maintained for safety.

## 2016-07-13 NOTE — Progress Notes (Signed)
Recreation Therapy Notes  Date: 07/13/16 Time: 0930 Location: [address] Group Room  Group Topic: Stress Management  Goal Area(s) Addresses:  Patient will verbalize importance of using healthy stress management.  Patient will identify positive emotions associated with healthy stress management.   Intervention: Stress Management  Activity :  Meditation.  LRT introduced the stress management technique of meditation.  LRT played Warren meditation from the Calm app to allows patients to engage and learn the benefits of meditation.  Patiens were to follow along as the meditation played to engage in the technique.  Education:  Stress Management, Discharge Planning.   Education Outcome: Acknowledges edcuation/In group clarification offered/Needs additional education  Clinical Observations/Feedback: Pt did not attend group.    Nathan Warren, LRT/CTRS         Nathan Warren 07/13/2016 12:46 PM 
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
## 2024-06-25 ENCOUNTER — Ambulatory Visit
Admission: EM | Admit: 2024-06-25 | Discharge: 2024-06-25 | Disposition: A | Discharge status: Home / Self Care | Payer: Self-pay | Source: Home / Self Care

## 2024-06-25 ENCOUNTER — Encounter: Payer: Self-pay | Admitting: Emergency Medicine

## 2024-06-25 ENCOUNTER — Other Ambulatory Visit: Payer: Self-pay

## 2024-06-25 ENCOUNTER — Ambulatory Visit: Payer: Self-pay

## 2024-06-25 DIAGNOSIS — W009XXA Unspecified fall due to ice and snow, initial encounter: Secondary | ICD-10-CM

## 2024-06-25 DIAGNOSIS — S82832A Other fracture of upper and lower end of left fibula, initial encounter for closed fracture: Secondary | ICD-10-CM

## 2024-06-25 MED ORDER — HYDROCODONE-ACETAMINOPHEN 5-325 MG PO TABS: 1.0000 | ORAL_TABLET | ORAL | 0 refills | Status: AC | PRN

## 2024-06-25 NOTE — Discharge Instructions (Addendum)
 Please make an appt with [facility] for further evaluation of injury.

## 2024-06-25 NOTE — ED Provider Notes (Signed)
 " [facility]    CSN: [id number] Arrival date & time: 06/25/24  1319      History   Chief Complaint Chief Complaint  Patient presents with   Ankle Pain    HPI Nathan Warren is a 29 y.o. male.   Pt presents today due to injury of left ankle that happened yesterday. Pt states that he slipped on ice on his steps yesterday and noticed swollen left ankle and inability to bear weight on left lower extremity this morning. Pt states that he is experiencing 5/10 pain. Pt denies use of medicine for pain.  The history is provided by the patient.  Ankle Pain   Past Medical History:  Diagnosis Date   Anxiety    Depression     Patient Active Problem List   Diagnosis Date Noted   Severe major depression (HCC) 07/10/2016    History reviewed. No pertinent surgical history.     Home Medications    Prior to Admission medications  Medication Sig Start Date End Date Taking? Authorizing Provider  HYDROcodone -acetaminophen  (NORCO/VICODIN) 5-325 MG tablet Take 1 tablet by mouth every 4 (four) hours as needed. 06/25/24  Yes Andra Corean BROCKS, PA-C  hydrOXYzine  (ATARAX /VISTARIL ) 25 MG tablet Take 1 tablet (25 mg total) by mouth 3 (three) times daily as needed for anxiety. 07/13/16   Collene Gouge I, NP  mirtazapine  (REMERON ) 7.5 MG tablet Take 1 tablet (7.5 mg total) by mouth at bedtime. For insomnia 07/13/16   Collene Gouge FERNS, NP    Family History History reviewed. No pertinent family history.  Social History Social History[1]   Allergies   Patient has no known allergies.   Review of Systems Review of Systems   Physical Exam Triage Vital Signs ED Triage Vitals [06/25/24 1410]  Encounter Vitals Group     BP (!) 143/87     Girls Systolic BP Percentile      Girls Diastolic BP Percentile      Boys Systolic BP Percentile      Boys Diastolic BP Percentile      Pulse Rate (!) 110     Resp 18     Temp 98.5 F (36.9 C)     Temp Source Oral     SpO2 97 %     Weight       Height      Head Circumference      Peak Flow      Pain Score 8     Pain Loc      Pain Education      Exclude from Growth Chart    No data found.  Updated Vital Signs BP (!) 143/87 (BP Location: Left Arm)   Pulse (!) 110   Temp 98.5 F (36.9 C) (Oral)   Resp 18   SpO2 97%   Visual Acuity Right Eye Distance:   Left Eye Distance:   Bilateral Distance:    Right Eye Near:   Left Eye Near:    Bilateral Near:     Physical Exam Vitals and nursing note reviewed.  Constitutional:      General: He is not in acute distress.    Appearance: Normal appearance. He is not ill-appearing, toxic-appearing or diaphoretic.  Eyes:     General: No scleral icterus. Cardiovascular:     Rate and Rhythm: Normal rate and regular rhythm.     Heart sounds: Normal heart sounds.  Pulmonary:     Effort: Pulmonary effort is normal. No respiratory distress.  Breath sounds: Normal breath sounds. No wheezing or rhonchi.  Musculoskeletal:     Comments: Marked edema noted of left ankle, with violaceous ecchymosis of lateral and medial malleoli, significantly reduced ROM of ankle  Skin:    General: Skin is warm.  Neurological:     Mental Status: He is alert and oriented to person, place, and time.  Psychiatric:        Mood and Affect: Mood normal.        Behavior: Behavior normal.      UC Treatments / Results  Labs (all labs ordered are listed, but only abnormal results are displayed) Labs Reviewed - No data to display  EKG   Radiology DG Ankle Complete Left Result Date: 06/25/2024 CLINICAL DATA:  Substance fell yesterday, twisting injury EXAM: LEFT ANKLE COMPLETE - 3+ VIEW COMPARISON:  None Available. FINDINGS: Frontal, oblique, and lateral views of the left ankle are obtained. There is a minimally displaced fracture through the lateral malleolus, with approximately 3 mm of separation at the fracture site. A minimally displaced fracture is seen in the coronal plane through the  posterior malleolus on the lateral view, with near anatomic alignment. Grossly normal alignment of the tibiotalar joint. There is diffuse soft tissue swelling, greatest laterally. IMPRESSION: 1. Lateral and posterior malleolar fractures as above, with near anatomic alignment. 2. Diffuse soft tissue swelling. Electronically Signed   By: Ozell Daring M.D.   On: 06/25/2024 14:55    Procedures Procedures (including critical care time)  Medications Ordered in UC Medications - No data to display  Initial Impression / Assessment and Plan / UC Course  I have reviewed the triage vital signs and the nursing notes.  Pertinent labs & imaging results that were available during my care of the patient were reviewed by me and considered in my medical decision making (see chart for details).     Final Clinical Impressions(s) / UC Diagnoses   Final diagnoses:  Fall due to slipping on ice or snow, initial encounter  Closed fracture of distal end of left fibula, unspecified fracture morphology, initial encounter     Discharge Instructions      Please make an appt with [facility] for further evaluation of injury.      ED Prescriptions     Medication Sig Dispense Auth. Provider   HYDROcodone -acetaminophen  (NORCO/VICODIN) 5-325 MG tablet Take 1 tablet by mouth every 4 (four) hours as needed. 10 tablet Andra Corean BROCKS, PA-C      I have reviewed the PDMP during this encounter.    [1]  Social History Tobacco Use   Smoking status: Never   Smokeless tobacco: Never  Substance Use Topics   Alcohol use: Yes    Alcohol/week: 6.0 standard drinks of alcohol    Types: 6 Cans of beer per week   Drug use: Yes    Frequency: 1.0 times per week    Types: Marijuana     Andra Corean BROCKS, PA-C 06/25/24 1513  "
